# Patient Record
Sex: Female | Born: 1980 | Hispanic: No | Marital: Married | Smoking: Never smoker
Health system: Southern US, Community
[De-identification: ages and names within clinical notes are randomized; demographics above are authoritative.]

## PROBLEM LIST (undated history)

## (undated) ENCOUNTER — Inpatient Hospital Stay: Admission: AD | Payer: Self-pay | Source: Home / Self Care

## (undated) ENCOUNTER — Inpatient Hospital Stay (HOSPITAL_COMMUNITY): Payer: Self-pay

## (undated) DIAGNOSIS — Z789 Other specified health status: Secondary | ICD-10-CM

## (undated) HISTORY — PX: NO PAST SURGERIES: SHX2092

---

## 2016-12-27 NOTE — L&D Delivery Note (Signed)
Patient is 36 y.o. [id number] [redacted]w[redacted]d admitted for SOL. SROM at 1238.  Prenatal course also complicated by unknown prenatal history as patient is from [place].  Delivery Note At 9:33 PM a viable female was delivered via Vaginal, Vacuum (Extractor) (Presentation: ROA).  APGAR: 8, 9; weight pending.   Placenta status: Avulsion of cord with manual extraction of placenta by Dr. Debroah Loop.  Cord: 3V.  Cord pH: N/A  Anesthesia:  Epidural Episiotomy: None Lacerations: 2nd degree deep Suture Repair: 3.0 vicryl Est. Blood Loss (mL): 500  Mom to postpartum.  Baby to Couplet care / Skin to Skin.  Patient pushing, and despite good maternal effort, unable to deliver fetal head, and decision made for vacuum-assisted delivery. R/B/A discussed with mother. She gave verbal consent. The vacuum soft cup was positioned over the sagittal suture 3 cm anterior to posterior fontanelle.  Pressure was then increased to 500 mmHg, and the patient was instructed to push.  Pulling was administered along the pelvic curve. She pushed with good maternal effort to deliver a viable infant. No nuchal cord present. Infant did have body cord and foot cord,easily reduced. Baby delivered without difficulty, was noted to have good tone and place on maternal abdomen for oral suctioning, drying and stimulation. Delayed cord clamping performed. Placenta cord avulsion occured and manual extraction of placenta was performed. Fundus firm with massage and Pitocin. Perineum inspected and found to have deep 2nd degree laceration, which was repaired with good hemostasis achieved. Counts of sharps, instruments, and lap pads were all correct.  Caryl Ada, DO OB Fellow Faculty Practice, [facility] - [facility] 08/22/2017, 9:57 PM

## 2017-02-14 LAB — OB RESULTS CONSOLE PLATELET COUNT: Platelets: 199

## 2017-02-14 LAB — OB RESULTS CONSOLE HEPATITIS B SURFACE ANTIGEN
HEP B S AG: NEGATIVE
Hepatitis B Surface Ag: NEGATIVE

## 2017-02-14 LAB — OB RESULTS CONSOLE HGB/HCT, BLOOD
HCT: 36
Hemoglobin: 11.8

## 2017-02-14 LAB — OB RESULTS CONSOLE RUBELLA ANTIBODY, IGM: Rubella: UNDETERMINED

## 2017-08-03 ENCOUNTER — Inpatient Hospital Stay (HOSPITAL_COMMUNITY)
Admission: AD | Admit: 2017-08-03 | Discharge: 2017-08-03 | Disposition: A | Discharge status: Home / Self Care | Payer: Medicaid Other | Source: Ambulatory Visit | Attending: Obstetrics and Gynecology | Admitting: Obstetrics and Gynecology

## 2017-08-03 ENCOUNTER — Encounter (HOSPITAL_COMMUNITY): Payer: Self-pay | Admitting: Family Medicine

## 2017-08-03 DIAGNOSIS — O479 False labor, unspecified: Secondary | ICD-10-CM

## 2017-08-03 DIAGNOSIS — M7989 Other specified soft tissue disorders: Secondary | ICD-10-CM | POA: Diagnosis present

## 2017-08-03 DIAGNOSIS — R109 Unspecified abdominal pain: Secondary | ICD-10-CM | POA: Diagnosis present

## 2017-08-03 DIAGNOSIS — Z8249 Family history of ischemic heart disease and other diseases of the circulatory system: Secondary | ICD-10-CM | POA: Diagnosis not present

## 2017-08-03 DIAGNOSIS — Z807 Family history of other malignant neoplasms of lymphoid, hematopoietic and related tissues: Secondary | ICD-10-CM | POA: Diagnosis not present

## 2017-08-03 DIAGNOSIS — Z833 Family history of diabetes mellitus: Secondary | ICD-10-CM | POA: Diagnosis not present

## 2017-08-03 HISTORY — DX: Other specified health status: Z78.9

## 2017-08-03 LAB — TYPE AND SCREEN
ABO/RH(D): A POS
ANTIBODY SCREEN: NEGATIVE

## 2017-08-03 LAB — CBC
HEMATOCRIT: 31.7 % — AB (ref 36.0–46.0)
HEMOGLOBIN: 10.7 g/dL — AB (ref 12.0–15.0)
MCH: 28 pg (ref 26.0–34.0)
MCHC: 33.8 g/dL (ref 30.0–36.0)
MCV: 83 fL (ref 78.0–100.0)
Platelets: 186 10*3/uL (ref 150–400)
RBC: 3.82 MIL/uL — ABNORMAL LOW (ref 3.87–5.11)
RDW: 13.9 % (ref 11.5–15.5)
WBC: 7.8 10*3/uL (ref 4.0–10.5)

## 2017-08-03 LAB — DIFFERENTIAL
BASOS ABS: 0 10*3/uL (ref 0.0–0.1)
BASOS PCT: 0 %
Eosinophils Absolute: 0.1 10*3/uL (ref 0.0–0.7)
Eosinophils Relative: 1 %
Lymphocytes Relative: 23 %
Lymphs Abs: 1.8 10*3/uL (ref 0.7–4.0)
MONOS PCT: 7 %
Monocytes Absolute: 0.5 10*3/uL (ref 0.1–1.0)
Neutro Abs: 5.5 10*3/uL (ref 1.7–7.7)
Neutrophils Relative %: 69 %

## 2017-08-03 NOTE — Discharge Instructions (Signed)
Braxton Hicks Contractions °Contractions of the uterus can occur throughout pregnancy, but they are not always a sign that you are in labor. You may have practice contractions called Braxton Hicks contractions. These false labor contractions are sometimes confused with true labor. °What are Braxton Hicks contractions? °Braxton Hicks contractions are tightening movements that occur in the muscles of the uterus before labor. Unlike true labor contractions, these contractions do not result in opening (dilation) and thinning of the cervix. Toward the end of pregnancy (32-34 weeks), Braxton Hicks contractions can happen more often and may become stronger. These contractions are sometimes difficult to tell apart from true labor because they can be very uncomfortable. You should not feel embarrassed if you go to the hospital with false labor. °Sometimes, the only way to tell if you are in true labor is for your health care provider to look for changes in the cervix. The health care provider will do a physical exam and may monitor your contractions. If you are not in true labor, the exam should show that your cervix is not dilating and your water has not broken. °If there are no prenatal problems or other health problems associated with your pregnancy, it is completely safe for you to be sent home with false labor. You may continue to have Braxton Hicks contractions until you go into true labor. °How can I tell the difference between true labor and false labor? °· Differences °? False labor °? Contractions last 30-70 seconds.: Contractions are usually shorter and not as strong as true labor contractions. °? Contractions become very regular.: Contractions are usually irregular. °? Discomfort is usually felt in the top of the uterus, and it spreads to the lower abdomen and low back.: Contractions are often felt in the front of the lower abdomen and in the groin. °? Contractions do not go away with walking.: Contractions may  go away when you walk around or change positions while lying down. °? Contractions usually become more intense and increase in frequency.: Contractions get weaker and are shorter-lasting as time goes on. °? The cervix dilates and gets thinner.: The cervix usually does not dilate or become thin. °Follow these instructions at home: °· Take over-the-counter and prescription medicines only as told by your health care provider. °· Keep up with your usual exercises and follow other instructions from your health care provider. °· Eat and drink lightly if you think you are going into labor. °· If Braxton Hicks contractions are making you uncomfortable: °? Change your position from lying down or resting to walking, or change from walking to resting. °? Sit and rest in a tub of warm water. °? Drink enough fluid to keep your urine clear or pale yellow. Dehydration may cause these contractions. °? Do slow and deep breathing several times an hour. °· Keep all follow-up prenatal visits as told by your health care provider. This is important. °Contact a health care provider if: °· You have a fever. °· You have continuous pain in your abdomen. °Get help right away if: °· Your contractions become stronger, more regular, and closer together. °· You have fluid leaking or gushing from your vagina. °· You pass blood-tinged mucus (bloody show). °· You have bleeding from your vagina. °· You have low back pain that you never had before. °· You feel your baby’s head pushing down and causing pelvic pressure. °· Your baby is not moving inside you as much as it used to. °Summary °· Contractions that occur before labor are   called Braxton Hicks contractions, false labor, or practice contractions.  Braxton Hicks contractions are usually shorter, weaker, farther apart, and less regular than true labor contractions. True labor contractions usually become progressively stronger and regular and they become more frequent.  Manage discomfort from  [facility] Center[facility] contractions by changing position, resting in a warm bath, drinking plenty of water, or practicing deep breathing. This information is not intended to replace advice given to you by your health care provider. Make sure you discuss any questions you have with your health care provider. Document Released: 12/13/2005 Document Revised: 11/01/2016 Document Reviewed: 11/01/2016 Elsevier Interactive Patient Education  2017 Elsevier Inc.   Edema Edema is when you have too much fluid in your body or under your skin. Edema may make your legs, feet, and ankles swell up. Swelling is also common in looser tissues, like around your eyes. This is a common condition. It gets more common as you get older. There are many possible causes of edema. Eating too much salt (sodium) and being on your feet or sitting for a long time can cause edema in your legs, feet, and ankles. Hot weather may make edema worse. Edema is usually painless. Your skin may look swollen or shiny. Follow these instructions at home:  Keep the swollen body part raised (elevated) above the level of your heart when you are sitting or lying down.  Do not sit still or stand for a long time.  Do not wear tight clothes. Do not wear garters on your upper legs.  Exercise your legs. This can help the swelling go down.  Wear elastic bandages or support stockings as told by your doctor.  Eat a low-salt (low-sodium) diet to reduce fluid as told by your doctor.  Depending on the cause of your swelling, you may need to limit how much fluid you drink (fluid restriction).  Take over-the-counter and prescription medicines only as told by your doctor. Contact a doctor if:  Treatment is not working.  You have heart, liver, or kidney disease and have symptoms of edema.  You have sudden and unexplained weight gain. Get help right away if:  You have shortness of breath or chest pain.  You cannot breathe when you lie down.  You have  pain, redness, or warmth in the swollen areas.  You have heart, liver, or kidney disease and get edema all of a sudden.  You have a fever and your symptoms get worse all of a sudden. Summary  Edema is when you have too much fluid in your body or under your skin.  Edema may make your legs, feet, and ankles swell up. Swelling is also common in looser tissues, like around your eyes.  Raise (elevate) the swollen body part above the level of your heart when you are sitting or lying down.  Follow your doctor's instructions about diet and how much fluid you can drink (fluid restriction). This information is not intended to replace advice given to you by your health care provider. Make sure you discuss any questions you have with your health care provider. Document Released: 05/31/2008 Document Revised: 12/31/2016 Document Reviewed: 12/31/2016 Elsevier Interactive Patient Education  2017 [employer]

## 2017-08-03 NOTE — MAU Note (Signed)
Pt presents with complaint of abd pain and reports increased swelling in her legs, states R>L. Pt states she had care in [place] until about 1.5 months ago , plans to return in one week

## 2017-08-03 NOTE — MAU Provider Note (Signed)
  History     CSN: [id number]  Arrival date and time: 08/03/17 1756   None    Chief Complaint  Patient presents with  . Abdominal Pain  . Leg Swelling   HPI  Patient is complaining of cramping and pain in her lower abdomen. The pain is not constant but comes and goes. It started this morning. She is also complaining of increased edema in her legs, and stating her right leg is more swollen than her left. No pain in her legs. No associated erythema. She has recently been walking more and spent a lot of time walking in [place]. She has not had care for 1-1.5 months since she has arrived here to visit her sister. All her prenatal care is in [place]. The patient plans to fly back in one week to [place].   +FM, No LOF, No vaginal bleeding or discharge  OB History    Gravida Para Term Preterm AB Living   3 1 1  0 1 1   SAB TAB Ectopic Multiple Live Births   1              History reviewed. No pertinent past medical history.  History reviewed. No pertinent surgical history.  Family History  Problem Relation Age of Onset  . Diabetes Mother   . Hypertension Mother   . Hypertension Father   . Lymphoma Sister     Social History  Substance Use Topics  . Smoking status: Never Smoker  . Smokeless tobacco: Never Used  . Alcohol use No    Allergies: No Known Allergies  No prescriptions prior to admission.    Review of Systems  Cardiovascular: Positive for leg swelling.  Genitourinary: Positive for pelvic pain. Negative for vaginal bleeding and vaginal discharge.   Physical Exam   Temp: 98.5; Pulse: 103; RR 19; BP: 124/75; Pox: 100%  Physical Exam  Constitutional: She is oriented to person, place, and time. She appears well-developed and well-nourished.  HENT:  Head: Normocephalic.  Eyes: Conjunctivae are normal.  Neck: Normal range of motion. Neck supple.  Respiratory: Effort normal.  GI: Soft.  Genitourinary: No bleeding in the vagina. No vaginal discharge found.   Neurological: She is alert and oriented to person, place, and time.  Skin: Skin is warm and dry.  Extremities: no edema noted on BLLE Dilation: Closed Effacement (%): Thick Cervical Position: Posterior Station: -3 Presentation: Vertex Exam by:: weston,RN   MAU Course  Procedures  MDM: Patient examined by nurse with speculum and found to be closed.  Patient to have all prenatal labs drawn per orders FHT reactive; Toco with contractions every 5-10 minutes  Assessment and Plan  #BLLE Edema, Benign: No signs of DVT, educated Most likely psychologic leg swelling due to pregnancy  #Braxton Hick's Contractions, False Labor: Educated on her contractions Discussed return precautions for true labor. She was educated on the risks of going back to [place] on an 18 hour flight in one week. Risks of DVT explained. She was educated that she is not in active labor, but it could happen before her flight.  Prenatal labs ordered for patient along with cultures  Discharge home in stable condition  Handout given with information; see AVS  [place] Shirley 08/03/2017, 6:48 PM   OB FELLOW MAU DISCHARGE ATTESTATION  I have seen and examined this patient. I agree with above documentation in resident's note and have made edits as needed.   Caryl AdaJazma Taiz Bickle, DO OB Fellow 7:39 PM

## 2017-08-04 LAB — HIV ANTIBODY (ROUTINE TESTING W REFLEX): HIV SCREEN 4TH GENERATION: NONREACTIVE

## 2017-08-04 LAB — HEPATITIS B SURFACE ANTIGEN: HEP B S AG: NEGATIVE

## 2017-08-04 LAB — GC/CHLAMYDIA PROBE AMP (~~LOC~~) NOT AT ARMC
CHLAMYDIA, DNA PROBE: NEGATIVE
Neisseria Gonorrhea: NEGATIVE

## 2017-08-04 LAB — RUBELLA SCREEN: Rubella: 0.97 index — ABNORMAL LOW (ref >0.99)

## 2017-08-04 LAB — RPR: RPR Ser Ql: NONREACTIVE

## 2017-08-04 LAB — ABO/RH: ABO/RH(D): A POS

## 2017-08-06 LAB — CULTURE, BETA STREP (GROUP B ONLY)

## 2017-08-06 LAB — OB RESULTS CONSOLE GBS: STREP GROUP B AG: POSITIVE

## 2017-08-17 ENCOUNTER — Encounter: Payer: Self-pay | Admitting: *Deleted

## 2017-08-18 ENCOUNTER — Telehealth (HOSPITAL_COMMUNITY): Payer: Self-pay | Admitting: *Deleted

## 2017-08-18 ENCOUNTER — Encounter: Payer: Self-pay | Admitting: Medical

## 2017-08-18 ENCOUNTER — Ambulatory Visit (INDEPENDENT_AMBULATORY_CARE_PROVIDER_SITE_OTHER): Payer: Self-pay | Admitting: Medical

## 2017-08-18 DIAGNOSIS — Z3483 Encounter for supervision of other normal pregnancy, third trimester: Secondary | ICD-10-CM

## 2017-08-18 DIAGNOSIS — Z3480 Encounter for supervision of other normal pregnancy, unspecified trimester: Secondary | ICD-10-CM | POA: Insufficient documentation

## 2017-08-18 LAB — POCT URINALYSIS DIP (DEVICE)
BILIRUBIN URINE: NEGATIVE
Glucose, UA: NEGATIVE mg/dL
Ketones, ur: NEGATIVE mg/dL
Leukocytes, UA: NEGATIVE
NITRITE: NEGATIVE
PH: 5 (ref 5.0–8.0)
Protein, ur: NEGATIVE mg/dL
Specific Gravity, Urine: 1.01 (ref 1.005–1.030)
UROBILINOGEN UA: 0.2 mg/dL (ref 0.0–1.0)

## 2017-08-18 NOTE — Patient Instructions (Signed)
Fetal Movement Counts °Patient Name: ________________________________________________ Patient Due Date: ____________________ °What is a fetal movement count? °A fetal movement count is the number of times that you feel your baby move during a certain amount of time. This may also be called a fetal kick count. A fetal movement count is recommended for every pregnant woman. You may be asked to start counting fetal movements as early as week 28 of your pregnancy. °Pay attention to when your baby is most active. You may notice your baby's sleep and wake cycles. You may also notice things that make your baby move more. You should do a fetal movement count: °· When your baby is normally most active. °· At the same time each day. ° °A good time to count movements is while you are resting, after having something to eat and drink. °How do I count fetal movements? °1. Find a quiet, comfortable area. Sit, or lie down on your side. °2. Write down the date, the start time and stop time, and the number of movements that you felt between those two times. Take this information with you to your health care visits. °3. For 2 hours, count kicks, flutters, swishes, rolls, and jabs. You should feel at least 10 movements during 2 hours. °4. You may stop counting after you have felt 10 movements. °5. If you do not feel 10 movements in 2 hours, have something to eat and drink. Then, keep resting and counting for 1 hour. If you feel at least 4 movements during that hour, you may stop counting. °Contact a health care provider if: °· You feel fewer than 4 movements in 2 hours. °· Your baby is not moving like he or she usually does. °Date: ____________ Start time: ____________ Stop time: ____________ Movements: ____________ °Date: ____________ Start time: ____________ Stop time: ____________ Movements: ____________ °Date: ____________ Start time: ____________ Stop time: ____________ Movements: ____________ °Date: ____________ Start time:  ____________ Stop time: ____________ Movements: ____________ °Date: ____________ Start time: ____________ Stop time: ____________ Movements: ____________ °Date: ____________ Start time: ____________ Stop time: ____________ Movements: ____________ °Date: ____________ Start time: ____________ Stop time: ____________ Movements: ____________ °Date: ____________ Start time: ____________ Stop time: ____________ Movements: ____________ °Date: ____________ Start time: ____________ Stop time: ____________ Movements: ____________ °This information is not intended to replace advice given to you by your health care provider. Make sure you discuss any questions you have with your health care provider. °Document Released: 01/12/2007 Document Revised: 08/11/2016 Document Reviewed: 01/22/2016 °Elsevier Interactive Patient Education © 2018 Elsevier Inc. °Braxton Hicks Contractions °Contractions of the uterus can occur throughout pregnancy, but they are not always a sign that you are in labor. You may have practice contractions called Braxton Hicks contractions. These false labor contractions are sometimes confused with true labor. °What are Braxton Hicks contractions? °Braxton Hicks contractions are tightening movements that occur in the muscles of the uterus before labor. Unlike true labor contractions, these contractions do not result in opening (dilation) and thinning of the cervix. Toward the end of pregnancy (32-34 weeks), Braxton Hicks contractions can happen more often and may become stronger. These contractions are sometimes difficult to tell apart from true labor because they can be very uncomfortable. You should not feel embarrassed if you go to the hospital with false labor. °Sometimes, the only way to tell if you are in true labor is for your health care provider to look for changes in the cervix. The health care provider will do a physical exam and may monitor your contractions. If   you are not in true labor, the exam  should show that your cervix is not dilating and your water has not broken. °If there are no prenatal problems or other health problems associated with your pregnancy, it is completely safe for you to be sent home with false labor. You may continue to have Braxton Hicks contractions until you go into true labor. °How can I tell the difference between true labor and false labor? °· Differences °? False labor °? Contractions last 30-70 seconds.: Contractions are usually shorter and not as strong as true labor contractions. °? Contractions become very regular.: Contractions are usually irregular. °? Discomfort is usually felt in the top of the uterus, and it spreads to the lower abdomen and low back.: Contractions are often felt in the front of the lower abdomen and in the groin. °? Contractions do not go away with walking.: Contractions may go away when you walk around or change positions while lying down. °? Contractions usually become more intense and increase in frequency.: Contractions get weaker and are shorter-lasting as time goes on. °? The cervix dilates and gets thinner.: The cervix usually does not dilate or become thin. °Follow these instructions at home: °· Take over-the-counter and prescription medicines only as told by your health care provider. °· Keep up with your usual exercises and follow other instructions from your health care provider. °· Eat and drink lightly if you think you are going into labor. °· If Braxton Hicks contractions are making you uncomfortable: °? Change your position from lying down or resting to walking, or change from walking to resting. °? Sit and rest in a tub of warm water. °? Drink enough fluid to keep your urine clear or pale yellow. Dehydration may cause these contractions. °? Do slow and deep breathing several times an hour. °· Keep all follow-up prenatal visits as told by your health care provider. This is important. °Contact a health care provider if: °· You have a  fever. °· You have continuous pain in your abdomen. °Get help right away if: °· Your contractions become stronger, more regular, and closer together. °· You have fluid leaking or gushing from your vagina. °· You pass blood-tinged mucus (bloody show). °· You have bleeding from your vagina. °· You have low back pain that you never had before. °· You feel your baby’s head pushing down and causing pelvic pressure. °· Your baby is not moving inside you as much as it used to. °Summary °· Contractions that occur before labor are called Braxton Hicks contractions, false labor, or practice contractions. °· Braxton Hicks contractions are usually shorter, weaker, farther apart, and less regular than true labor contractions. True labor contractions usually become progressively stronger and regular and they become more frequent. °· Manage discomfort from Braxton Hicks contractions by changing position, resting in a warm bath, drinking plenty of water, or practicing deep breathing. °This information is not intended to replace advice given to you by your health care provider. Make sure you discuss any questions you have with your health care provider. °Document Released: 12/13/2005 Document Revised: 11/01/2016 Document Reviewed: 11/01/2016 °Elsevier Interactive Patient Education © 2017 Elsevier Inc. ° °

## 2017-08-18 NOTE — Progress Notes (Signed)
   PRENATAL VISIT NOTE  Subjective:  Brenda Frazier is a 36 y.o. G3P1011 at [redacted]w[redacted]d being seen today for ongoing prenatal care.  She is currently monitored for the following issues for this low-risk pregnancy and has Supervision of other normal pregnancy, antepartum on her problem list.  Patient reports swelling in feet, SOB at times.  Contractions: Irregular. Vag. Bleeding: None.  Movement: Present. Denies leaking of fluid.   The following portions of the patient's history were reviewed and updated as appropriate: allergies, current medications, past family history, past medical history, past social history, past surgical history and problem list. Problem list updated.  Objective:   Vitals:   08/18/17 0842 08/18/17 0852  BP: 122/74   Pulse: 88   Weight: 206 lb 12.8 oz (93.8 kg)   Height:  5' 3.78" (1.62 m)    Fetal Status: Fetal Heart Rate (bpm): 165 Fundal Height: 39 cm Movement: Present     General:  Alert, oriented and cooperative. Patient is in no acute distress.  Skin: Skin is warm and dry. No rash noted.   Cardiovascular: Normal heart rate noted  Respiratory: Normal respiratory effort, no problems with respiration noted  Abdomen: Soft, gravid, appropriate for gestational age.  Pain/Pressure: Present     Pelvic: Cervical exam performed Dilation: Closed Effacement (%): 50 Station: Ballotable  Extremities: Normal range of motion.  Edema: Trace  Mental Status:  Normal mood and affect. Normal behavior. Normal judgment and thought content.   Assessment and Plan:  Pregnancy: G3P1011 at [redacted]w[redacted]d  1. Supervision of other normal pregnancy, antepartum - Wasn't planning to be here for delivery, but told not to travel after seen in MAU - IOL scheduled for 41 weeks - Plans to return to [place] after delivery   Term labor symptoms and general obstetric precautions including but not limited to vaginal bleeding, contractions, leaking of fluid and fetal movement were reviewed in detail with the  patient. Please refer to After Visit Summary for other counseling recommendations.  Return if symptoms worsen or fail to improve.   Vonzella Nipple, PA-C

## 2017-08-19 ENCOUNTER — Other Ambulatory Visit: Payer: Self-pay | Admitting: Medical

## 2017-08-19 NOTE — Telephone Encounter (Signed)
Preadmission screen  

## 2017-08-21 ENCOUNTER — Encounter (HOSPITAL_COMMUNITY): Payer: Self-pay

## 2017-08-21 ENCOUNTER — Inpatient Hospital Stay (HOSPITAL_COMMUNITY)
Admission: AD | Admit: 2017-08-21 | Discharge: 2017-08-24 | DRG: 767 | Disposition: A | Discharge status: Home / Self Care | Payer: Medicaid Other | Source: Ambulatory Visit | Attending: Obstetrics & Gynecology | Admitting: Obstetrics & Gynecology

## 2017-08-21 DIAGNOSIS — O4693 Antepartum hemorrhage, unspecified, third trimester: Secondary | ICD-10-CM

## 2017-08-21 DIAGNOSIS — E669 Obesity, unspecified: Secondary | ICD-10-CM | POA: Diagnosis present

## 2017-08-21 DIAGNOSIS — O9902 Anemia complicating childbirth: Secondary | ICD-10-CM | POA: Diagnosis present

## 2017-08-21 DIAGNOSIS — O480 Post-term pregnancy: Secondary | ICD-10-CM | POA: Diagnosis present

## 2017-08-21 DIAGNOSIS — D649 Anemia, unspecified: Secondary | ICD-10-CM | POA: Diagnosis present

## 2017-08-21 DIAGNOSIS — Z3A40 40 weeks gestation of pregnancy: Secondary | ICD-10-CM

## 2017-08-21 DIAGNOSIS — Z6835 Body mass index (BMI) 35.0-35.9, adult: Secondary | ICD-10-CM

## 2017-08-21 DIAGNOSIS — Z8759 Personal history of other complications of pregnancy, childbirth and the puerperium: Secondary | ICD-10-CM

## 2017-08-21 DIAGNOSIS — O99824 Streptococcus B carrier state complicating childbirth: Secondary | ICD-10-CM | POA: Diagnosis present

## 2017-08-21 DIAGNOSIS — O479 False labor, unspecified: Secondary | ICD-10-CM

## 2017-08-21 DIAGNOSIS — O99214 Obesity complicating childbirth: Secondary | ICD-10-CM | POA: Diagnosis present

## 2017-08-21 NOTE — MAU Note (Signed)
CTX since 1900 every 3-4 mins lasting 60 secs.  No VB or LOF.  Reports good FM.

## 2017-08-22 ENCOUNTER — Inpatient Hospital Stay (HOSPITAL_COMMUNITY): Payer: Medicaid Other | Admitting: Anesthesiology

## 2017-08-22 ENCOUNTER — Encounter (HOSPITAL_COMMUNITY): Payer: Self-pay

## 2017-08-22 ENCOUNTER — Ambulatory Visit (HOSPITAL_COMMUNITY): Payer: Medicaid Other

## 2017-08-22 DIAGNOSIS — O99824 Streptococcus B carrier state complicating childbirth: Secondary | ICD-10-CM | POA: Diagnosis not present

## 2017-08-22 DIAGNOSIS — Z6835 Body mass index (BMI) 35.0-35.9, adult: Secondary | ICD-10-CM | POA: Diagnosis not present

## 2017-08-22 DIAGNOSIS — O99214 Obesity complicating childbirth: Secondary | ICD-10-CM | POA: Diagnosis present

## 2017-08-22 DIAGNOSIS — E669 Obesity, unspecified: Secondary | ICD-10-CM | POA: Diagnosis present

## 2017-08-22 DIAGNOSIS — O480 Post-term pregnancy: Secondary | ICD-10-CM | POA: Diagnosis present

## 2017-08-22 DIAGNOSIS — O9902 Anemia complicating childbirth: Secondary | ICD-10-CM | POA: Diagnosis present

## 2017-08-22 DIAGNOSIS — Z3A40 40 weeks gestation of pregnancy: Secondary | ICD-10-CM | POA: Diagnosis not present

## 2017-08-22 DIAGNOSIS — Z8759 Personal history of other complications of pregnancy, childbirth and the puerperium: Secondary | ICD-10-CM

## 2017-08-22 DIAGNOSIS — Z3493 Encounter for supervision of normal pregnancy, unspecified, third trimester: Secondary | ICD-10-CM | POA: Diagnosis present

## 2017-08-22 DIAGNOSIS — D649 Anemia, unspecified: Secondary | ICD-10-CM | POA: Diagnosis present

## 2017-08-22 LAB — TYPE AND SCREEN
ABO/RH(D): A POS
Antibody Screen: NEGATIVE

## 2017-08-22 LAB — CBC
HEMATOCRIT: 33.8 % — AB (ref 36.0–46.0)
HEMOGLOBIN: 11.1 g/dL — AB (ref 12.0–15.0)
MCH: 27 pg (ref 26.0–34.0)
MCHC: 32.8 g/dL (ref 30.0–36.0)
MCV: 82.2 fL (ref 78.0–100.0)
Platelets: 176 10*3/uL (ref 150–400)
RBC: 4.11 MIL/uL (ref 3.87–5.11)
RDW: 14.3 % (ref 11.5–15.5)
WBC: 9.9 10*3/uL (ref 4.0–10.5)

## 2017-08-22 LAB — RPR: RPR Ser Ql: NONREACTIVE

## 2017-08-22 IMAGING — US US MFM OB LIMITED
1 series · 11 of 11 positions shown · non-contrast
Comparison: none

[Series 1: us mfm ob limited · 11 acquisitions, 11 frames shown]
[im 1/11]
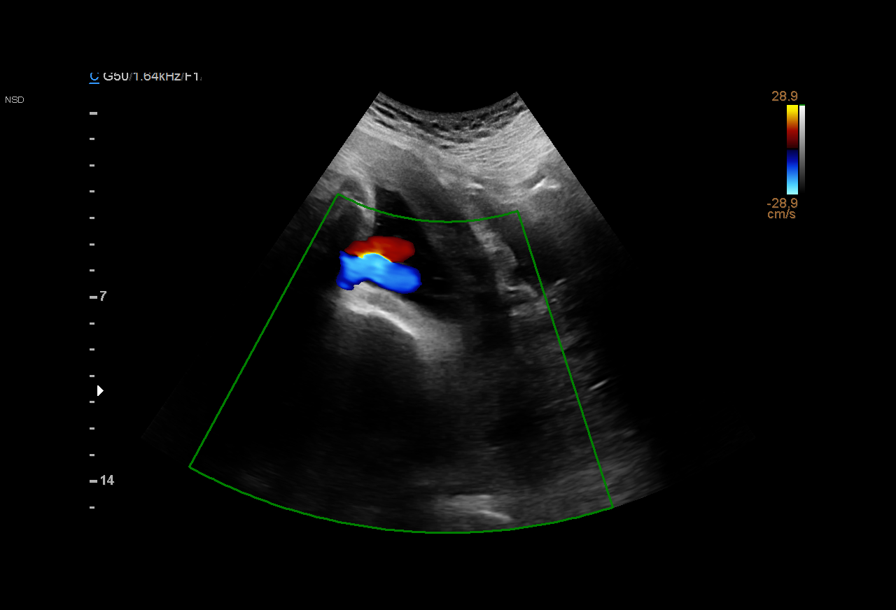
[im 2/11]
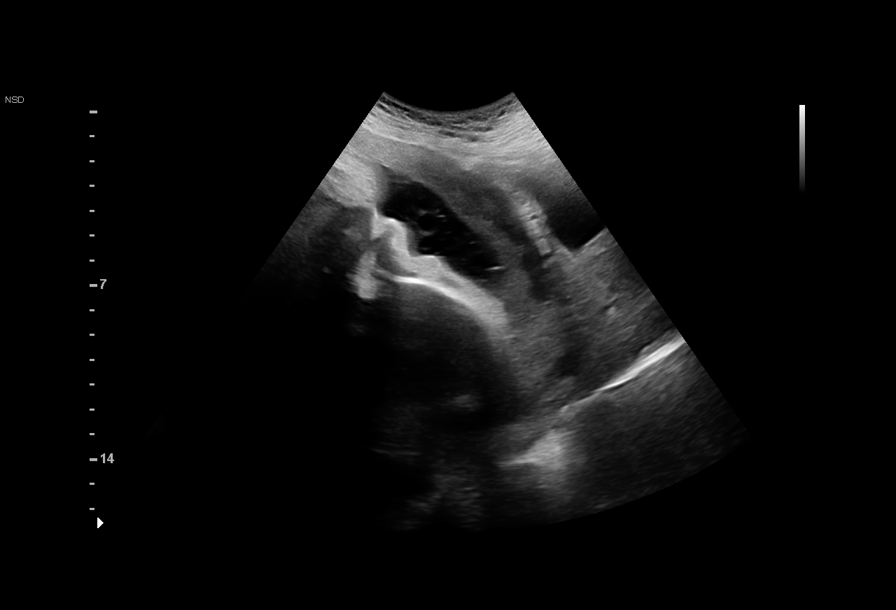
[im 3/11]
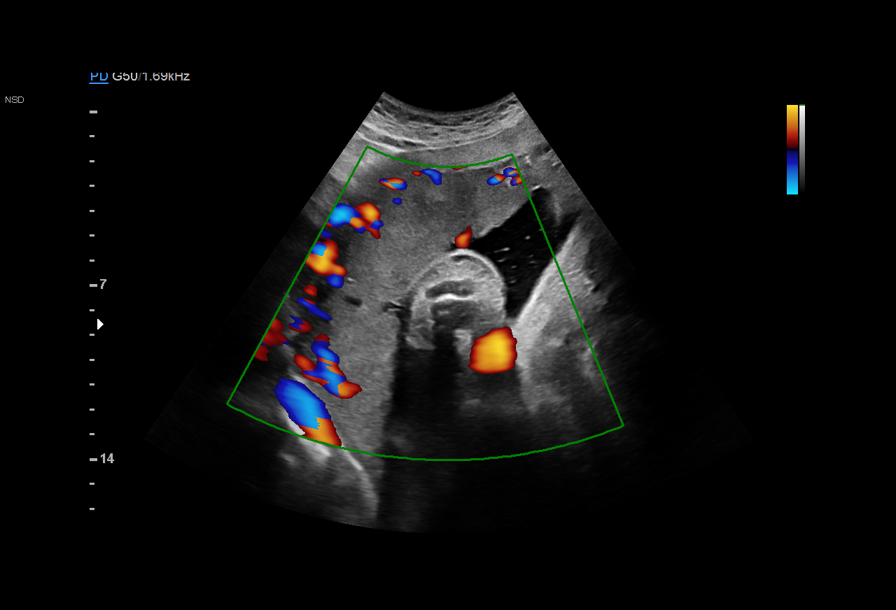
[im 4/11]
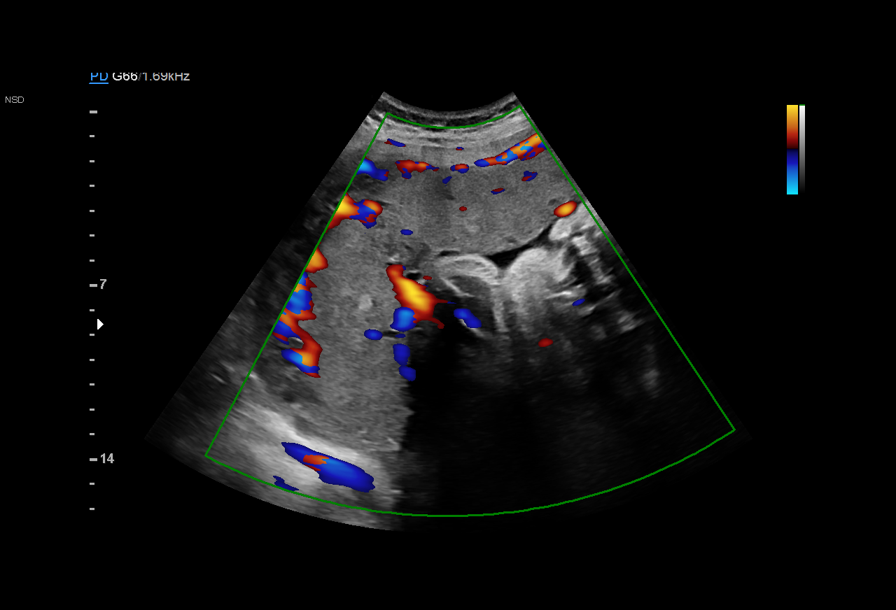
[im 5/11]
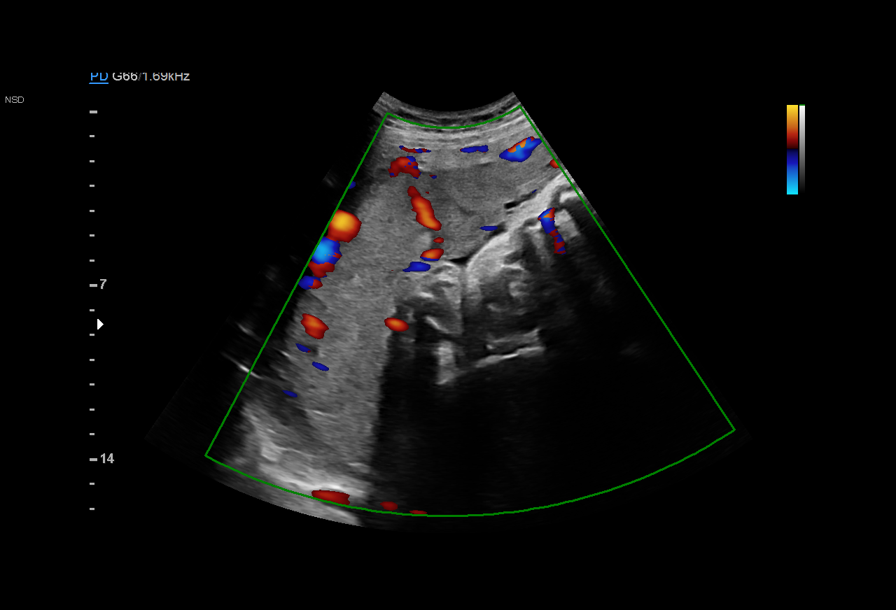
[im 6/11]
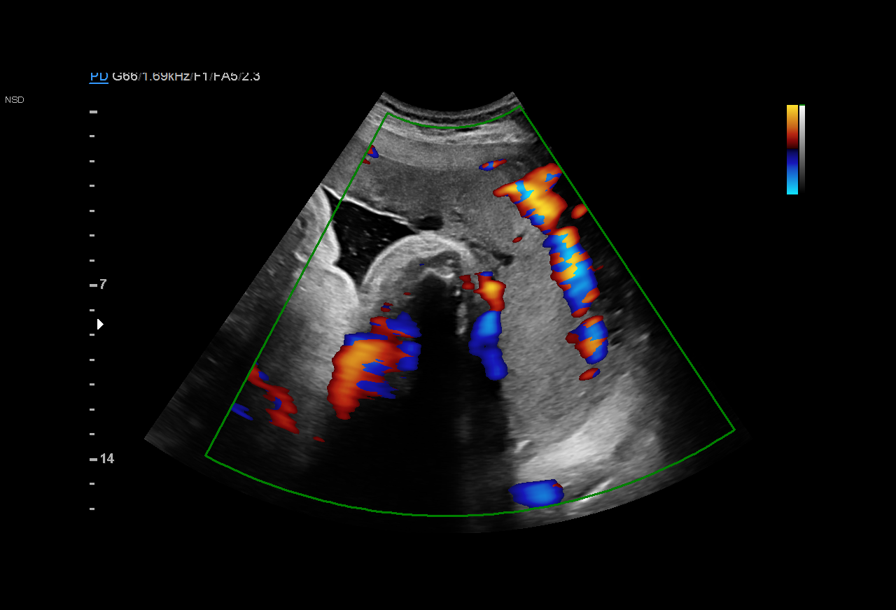
[im 7/11]
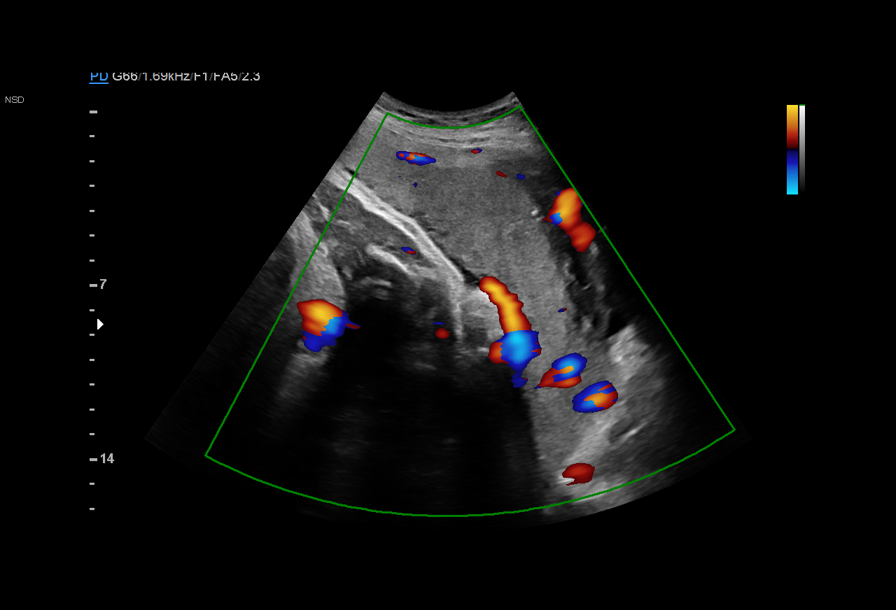
[im 8/11]
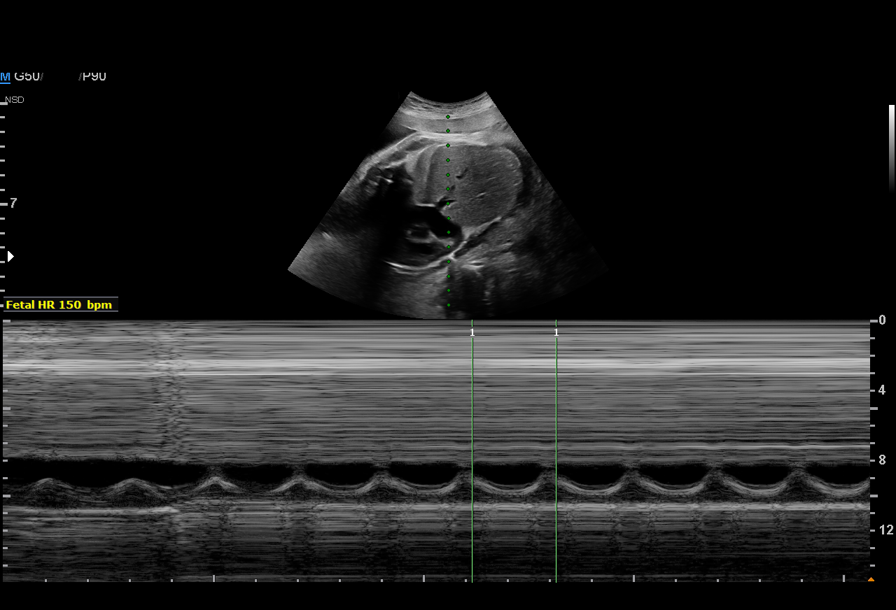
[im 9/11]
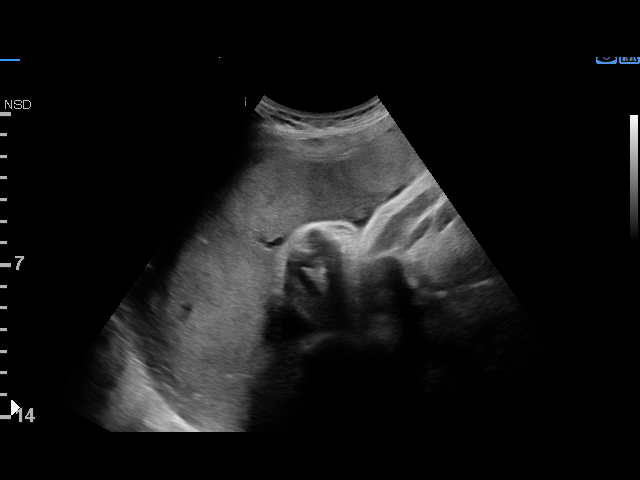
[im 10/11]
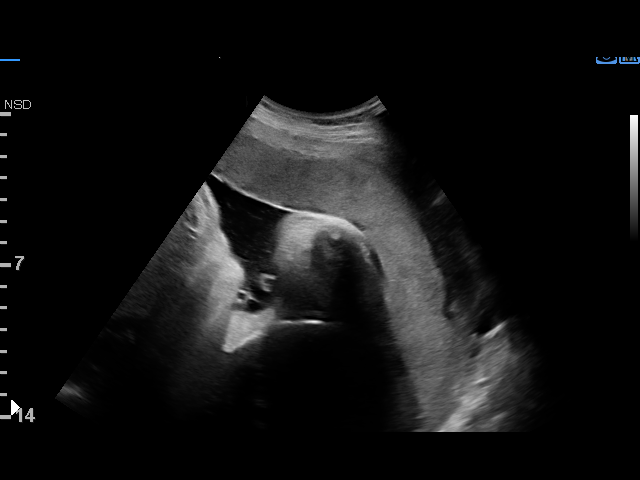
[im 11/11]
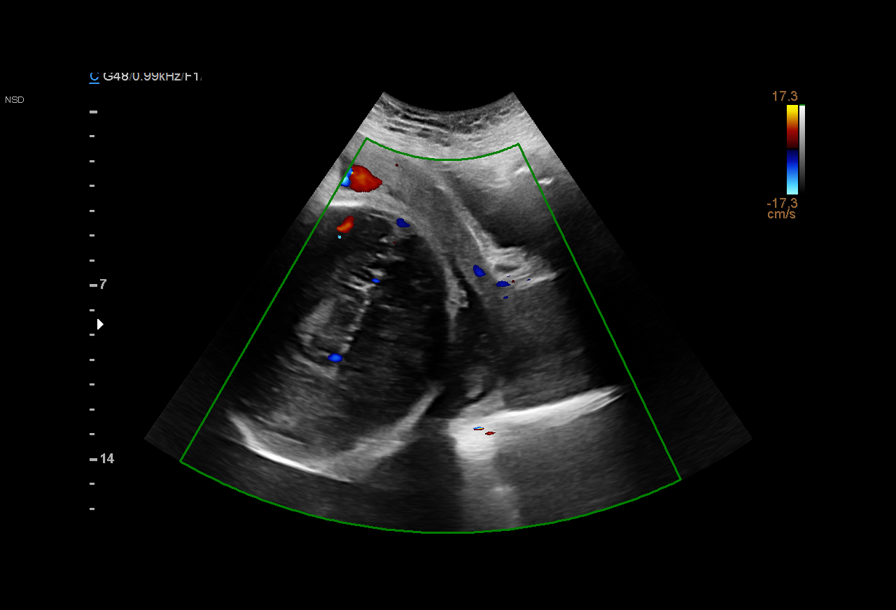

[11 of 11 positions shown; findings below may reference images not displayed]

Attending:        WATANABE       Secondary Phy.:    WATANABE Nursing-
MAU/Triage

1  WATANABE             [PHONE_NUMBER]      [PHONE_NUMBER]     [PHONE_NUMBER]
Indications

40 weeks gestation of pregnancy
Postdate pregnancy (40-42 weeks)               [GS]
Vaginal bleeding in pregnancy, third trimester [GS]
OB History

Gravidity:    3         Term:   1        Prem:   0        SAB:   1
TOP:          0       Ectopic:  0        Living: 1
Fetal Evaluation

Num Of Fetuses:     1
Fetal Heart         150
Rate(bpm):
Cardiac Activity:   Observed
Presentation:       Cephalic
Placenta:           Fundal, above cervical os
P. Cord Insertion:  Visualized

Amniotic Fluid
AFI FV:      Subjectively within normal limits

AFI Sum(cm)     %Tile       Largest Pocket(cm)
16.79           74

RUQ(cm)       RLQ(cm)       LUQ(cm)        LLQ(cm)
8.48
Gestational Age

Clinical EDD:  40w 2d                                        EDD:   [DATE]
Best:          40w 2d     Det. By:  Clinical EDD             EDD:   [DATE]
Cervix Uterus Adnexa

Cervix
Not visualized (advanced GA >[GS])
Impression

SIUP at [GS] (remote read only)
active fetus
cephalic presentation
AFI is normal
no previa
Recommendations

Suggest correlation with clinical exam and fetal heart tracing.

## 2017-08-22 MED ORDER — LACTATED RINGERS IV SOLN
INTRAVENOUS | Status: DC
  Administered 2017-08-22 (×4): via INTRAVENOUS

## 2017-08-22 MED ORDER — OXYCODONE-ACETAMINOPHEN 5-325 MG PO TABS
2.0000 | ORAL_TABLET | ORAL | Status: DC | PRN
  Administered 2017-08-23: 2 via ORAL
  Filled 2017-08-22: qty 2

## 2017-08-22 MED ORDER — LIDOCAINE HCL (PF) 1 % IJ SOLN
30.0000 mL | INTRAMUSCULAR | Status: DC | PRN
  Filled 2017-08-22: qty 30

## 2017-08-22 MED ORDER — PENICILLIN G POT IN DEXTROSE 60000 UNIT/ML IV SOLN
3.0000 10*6.[IU] | INTRAVENOUS | Status: DC
  Administered 2017-08-22 (×3): 3 10*6.[IU] via INTRAVENOUS
  Filled 2017-08-22 (×6): qty 50

## 2017-08-22 MED ORDER — EPHEDRINE 5 MG/ML INJ
10.0000 mg | INTRAVENOUS | Status: DC | PRN
  Filled 2017-08-22: qty 2

## 2017-08-22 MED ORDER — ONDANSETRON HCL 4 MG/2ML IJ SOLN
4.0000 mg | Freq: Four times a day (QID) | INTRAMUSCULAR | Status: DC | PRN
  Administered 2017-08-22: 4 mg via INTRAVENOUS
  Filled 2017-08-22: qty 2

## 2017-08-22 MED ORDER — LIDOCAINE HCL (PF) 1 % IJ SOLN
INTRAMUSCULAR | Status: DC | PRN
  Administered 2017-08-22: 5 mL via EPIDURAL
  Administered 2017-08-22: 2 mL via EPIDURAL
  Administered 2017-08-22: 3 mL via EPIDURAL

## 2017-08-22 MED ORDER — DIPHENHYDRAMINE HCL 50 MG/ML IJ SOLN: 12.5000 mg | INTRAMUSCULAR | Status: DC | PRN

## 2017-08-22 MED ORDER — FLEET ENEMA 7-19 GM/118ML RE ENEM: 1.0000 | ENEMA | RECTAL | Status: DC | PRN

## 2017-08-22 MED ORDER — LACTATED RINGERS IV SOLN
500.0000 mL | Freq: Once | INTRAVENOUS | Status: AC
  Administered 2017-08-22: 500 mL via INTRAVENOUS

## 2017-08-22 MED ORDER — LACTATED RINGERS IV SOLN
500.0000 mL | INTRAVENOUS | Status: DC | PRN
  Administered 2017-08-22: 500 mL via INTRAVENOUS
  Administered 2017-08-22 (×2): 1000 mL via INTRAVENOUS
  Administered 2017-08-22: 500 mL via INTRAVENOUS
  Administered 2017-08-22 (×2): 1000 mL via INTRAVENOUS

## 2017-08-22 MED ORDER — SOD CITRATE-CITRIC ACID 500-334 MG/5ML PO SOLN
30.0000 mL | ORAL | Status: DC | PRN
  Filled 2017-08-22: qty 15

## 2017-08-22 MED ORDER — FENTANYL CITRATE (PF) 100 MCG/2ML IJ SOLN: 50.0000 ug | INTRAMUSCULAR | Status: DC | PRN

## 2017-08-22 MED ORDER — TERBUTALINE SULFATE 1 MG/ML IJ SOLN
INTRAMUSCULAR | Status: AC
  Filled 2017-08-22: qty 1

## 2017-08-22 MED ORDER — PHENYLEPHRINE 40 MCG/ML (10ML) SYRINGE FOR IV PUSH (FOR BLOOD PRESSURE SUPPORT)
80.0000 ug | PREFILLED_SYRINGE | INTRAVENOUS | Status: DC | PRN
  Administered 2017-08-22: 80 ug via INTRAVENOUS
  Filled 2017-08-22: qty 5

## 2017-08-22 MED ORDER — TERBUTALINE SULFATE 1 MG/ML IJ SOLN
INTRAMUSCULAR | Status: AC
  Administered 2017-08-22: 0.25 mg via SUBCUTANEOUS
  Filled 2017-08-22: qty 1

## 2017-08-22 MED ORDER — OXYTOCIN 40 UNITS IN LACTATED RINGERS INFUSION - SIMPLE MED
2.5000 [IU]/h | INTRAVENOUS | Status: DC
  Filled 2017-08-22: qty 1000

## 2017-08-22 MED ORDER — PENICILLIN G POTASSIUM 5000000 UNITS IJ SOLR
5.0000 10*6.[IU] | Freq: Once | INTRAVENOUS | Status: AC
  Administered 2017-08-22: 5 10*6.[IU] via INTRAVENOUS
  Filled 2017-08-22: qty 5

## 2017-08-22 MED ORDER — OXYCODONE-ACETAMINOPHEN 5-325 MG PO TABS: 1.0000 | ORAL_TABLET | ORAL | Status: DC | PRN

## 2017-08-22 MED ORDER — FENTANYL 2.5 MCG/ML BUPIVACAINE 1/10 % EPIDURAL INFUSION (WH - ANES)
14.0000 mL/h | INTRAMUSCULAR | Status: DC | PRN
  Administered 2017-08-22 (×3): 14 mL/h via EPIDURAL
  Filled 2017-08-22 (×3): qty 100

## 2017-08-22 MED ORDER — ACETAMINOPHEN 325 MG PO TABS
650.0000 mg | ORAL_TABLET | ORAL | Status: DC | PRN
  Administered 2017-08-22 (×2): 650 mg via ORAL
  Filled 2017-08-22 (×2): qty 2

## 2017-08-22 MED ORDER — PHENYLEPHRINE 40 MCG/ML (10ML) SYRINGE FOR IV PUSH (FOR BLOOD PRESSURE SUPPORT)
80.0000 ug | PREFILLED_SYRINGE | INTRAVENOUS | Status: DC | PRN
  Administered 2017-08-22 (×2): 80 ug via INTRAVENOUS
  Filled 2017-08-22: qty 10
  Filled 2017-08-22: qty 5
  Filled 2017-08-22 (×2): qty 10

## 2017-08-22 MED ORDER — OXYTOCIN BOLUS FROM INFUSION
500.0000 mL | Freq: Once | INTRAVENOUS | Status: AC
  Administered 2017-08-22: 500 mL via INTRAVENOUS

## 2017-08-22 NOTE — Progress Notes (Signed)
Brenda Frazier is a 36 y.o. G3P1011 at [redacted]w[redacted]d admitted for active labor, vaginal bleeding at term  Subjective: Pt comfortable with epidural. Sister in room for support.   Objective: BP (!) 86/60   Pulse 82   Temp 98.7 F (37.1 C) (Oral)   Resp 20   Ht 5' 3.78" (1.62 m)   Wt 206 lb (93.4 kg)   SpO2 98%   BMI 35.60 kg/m  No intake/output data recorded. Total I/O In: -  Out: 1225 [Urine:1225]  FHT:  FHR: 140 bpm, variability: moderate,  accelerations:  Present,  decelerations:  Present Prolonged decleration x 7 minutes @ ~10:30 am with FHR down to 80s, returned to baseline with moderate variability after intrauterine resuscitation including position change, IV fluid bolus UC:   regular, every 3 minutes SVE:   Dilation: 6 Effacement (%): 60 Station: -3 Exam by:: L. Leftwich-Kirby, CNM  Labs: Lab Results  Component Value Date   WBC 9.9 08/22/2017   HGB 11.1 (L) 08/22/2017   HCT 33.8 (L) 08/22/2017   MCV 82.2 08/22/2017   PLT 176 08/22/2017    Assessment / Plan: Spontaneous labor, progressing normally  Labor: Called to bedside to evaluate prolonged deceleration. After exam, vertex position but high station, cervix noted over to maternal right side so likely malposition.  Encourage maternal positions to rotate/improve fetal position.  Continue expectant management. Preeclampsia:  n/a Fetal Wellbeing:  Category II Pain Control:  Epidural I/D:  GBS Pos on PCN Anticipated MOD:  NSVD  Sharen Counter 08/22/2017, 11:05 AM

## 2017-08-22 NOTE — Anesthesia Preprocedure Evaluation (Signed)
Anesthesia Evaluation  Patient identified by MRN, date of birth, ID band Patient awake    Reviewed: Allergy & Precautions, NPO status , Patient's Chart, lab work & pertinent test results  Airway Mallampati: III  TM Distance: >3 FB Neck ROM: Full    Dental  (+) Teeth Intact, Dental Advisory Given   Pulmonary neg pulmonary ROS,    Pulmonary exam normal breath sounds clear to auscultation       Cardiovascular negative cardio ROS Normal cardiovascular exam Rhythm:Regular Rate:Normal     Neuro/Psych negative neurological ROS     GI/Hepatic negative GI ROS, Neg liver ROS,   Endo/Other  Obesity   Renal/GU negative Renal ROS     Musculoskeletal negative musculoskeletal ROS (+)   Abdominal   Peds  Hematology  (+) Blood dyscrasia, anemia , Plt 176k   Anesthesia Other Findings Day of surgery medications reviewed with the patient.  Reproductive/Obstetrics (+) Pregnancy                             Anesthesia Physical Anesthesia Plan  ASA: II  Anesthesia Plan: Epidural   Post-op Pain Management:    Induction:   PONV Risk Score and Plan: Treatment may vary due to age or medical condition  Airway Management Planned:   Additional Equipment:   Intra-op Plan:   Post-operative Plan:   Informed Consent: I have reviewed the patients History and Physical, chart, labs and discussed the procedure including the risks, benefits and alternatives for the proposed anesthesia with the patient or authorized representative who has indicated his/her understanding and acceptance.   Dental advisory given  Plan Discussed with:   Anesthesia Plan Comments: (Patient identified. Risks/Benefits/Options discussed with patient including but not limited to bleeding, infection, nerve damage, paralysis, failed block, incomplete pain control, headache, blood pressure changes, nausea, vomiting, reactions to medication  both or allergic, itching and postpartum back pain. Confirmed with bedside nurse the patient's most recent platelet count. Confirmed with patient that they are not currently taking any anticoagulation, have any bleeding history or any family history of bleeding disorders. Patient expressed understanding and wished to proceed. All questions were answered. )        Anesthesia Quick Evaluation

## 2017-08-22 NOTE — Anesthesia Procedure Notes (Signed)
Epidural Patient location during procedure: OB Start time: 08/22/2017 2:24 AM End time: 08/22/2017 2:29 AM  Staffing Anesthesiologist: Cecile Hearing Performed: anesthesiologist   Preanesthetic Checklist Completed: patient identified, pre-op evaluation, timeout performed, IV checked, risks and benefits discussed and monitors and equipment checked  Epidural Patient position: sitting Prep: DuraPrep Patient monitoring: blood pressure and continuous pulse ox Approach: midline Location: L3-L4 Injection technique: LOR air  Needle:  Needle type: Tuohy  Needle gauge: 17 G Needle length: 9 cm Needle insertion depth: 5 cm Catheter size: 19 Gauge Catheter at skin depth: 10 cm Test dose: negative and Other (1% Lidocaine)  Additional Notes Patient identified.  Risk benefits discussed including failed block, incomplete pain control, headache, nerve damage, paralysis, blood pressure changes, nausea, vomiting, reactions to medication both toxic or allergic, and postpartum back pain.  Patient expressed understanding and wished to proceed.  All questions were answered.  Sterile technique used throughout procedure and epidural site dressed with sterile barrier dressing. No paresthesia or other complications noted. The patient did not experience any signs of intravascular injection such as tinnitus or metallic taste in mouth nor signs of intrathecal spread such as rapid motor block. Please see nursing notes for vital signs. Reason for block:procedure for pain

## 2017-08-22 NOTE — Anesthesia Pain Management Evaluation Note (Signed)
  CRNA Pain Management Visit Note  Patient: Brenda Frazier, 36 y.o., female  "Hello I am a member of the anesthesia team at [facility]. We have an anesthesia team available at all times to provide care throughout the hospital, including epidural management and anesthesia for C-section. I don't know your plan for the delivery whether it a natural birth, water birth, IV sedation, nitrous supplementation, doula or epidural, but we want to meet your pain goals."   1.Was your pain managed to your expectations on prior hospitalizations?   Yes   2.What is your expectation for pain management during this hospitalization?     Epidural  3.How can we help you reach that goal? unsure  Record the patient's initial score and the patient's pain goal.   Pain: 0  Pain Goal: 9 [facility] wants you to be able to say your pain was always managed very well.  Cephus Shelling 08/22/2017

## 2017-08-22 NOTE — H&P (Signed)
LABOR AND DELIVERY ADMISSION HISTORY AND PHYSICAL NOTE  Brenda Frazier is a 36 y.o. female G3P1011 with IUP at [redacted]w[redacted]d by patient report- prenatal care in home country([place])- presenting for SOL.   She came to MAU for frequent strong contractions. When standing up she had a large amount of vaginal bleeding. Upon recheck her cervix had dilated to 3-4 cm.  She reports positive fetal movement. She denies leakage of fluid.  Limited OB US showed cephalic position with fundal placenta  Prenatal History/Complications: Prenatal care in [place] Limited OB records from [place] in scanned chart - mostly results  Past Medical History: Past Medical History:  Diagnosis Date  . Medical history non-contributory     Past Surgical History: Past Surgical History:  Procedure Laterality Date  . NO PAST SURGERIES      Obstetrical History: OB History    Gravida Para Term Preterm AB Living   3 1 1  0 1 1   SAB TAB Ectopic Multiple Live Births   1       1      Social History: Social History   Social History  . Marital status: Married    Spouse name: N/A  . Number of children: N/A  . Years of education: N/A   Occupational History  . Pediatric oncologist    Social History Main Topics  . Smoking status: Never Smoker  . Smokeless tobacco: Never Used  . Alcohol use No  . Drug use: No  . Sexual activity: Yes   Other Topics Concern  . None   Social History Narrative  . None    Family History: Family History  Problem Relation Age of Onset  . Diabetes Mother   . Hypertension Mother   . Hypertension Father   . Lymphoma Sister     Allergies: No Known Allergies  Prescriptions Prior to Admission  Medication Sig Dispense Refill Last Dose  . Prenatal Multivit-Min-Fe-FA (PRENATAL VITAMINS PO) Take by mouth.        Review of Systems   All systems reviewed and negative except as stated in HPI  Blood pressure 114/68, pulse 91, temperature 97.8 F (36.6 C), temperature source Oral, resp.  rate 19, SpO2 99 %. General appearance: alert, cooperative and no distress Lungs: no respiratory distress Heart: regular rate Abdomen: soft, non-tender Extremities: No calf swelling or tenderness Presentation: cephalic by nurse exam3 Fetal monitoring: baseline 145, moderate variability. Accels present no decels Uterine activity: q3-4 minutes Dilation: 3.5 Effacement (%): 60 Station: Ballotable Exam by:: Camelia Eng RN  Prenatal labs: ABO, Rh: --/--/A POS, A POS (08/08 1910) Antibody: NEG (08/08 1910) Rubella: non-immune RPR: Non Reactive (08/08 1910)  HBsAg: Negative (08/08 1910)  HIV:   NR GBS:   positive Genetic screening:  Normal per patient Anatomy US: normal per patient  Prenatal Transfer Tool  Maternal Diabetes: No Genetic Screening: Normal Maternal Ultrasounds/Referrals: Normal Fetal Ultrasounds or other Referrals:  None Maternal Substance Abuse:  No Significant Maternal Medications:  None Significant Maternal Lab Results: None  Results for orders placed or performed during the hospital encounter of 08/21/17 (from the past 24 hour(s))  CBC   Collection Time: 08/22/17 12:45 AM  Result Value Ref Range   WBC 9.9 4.0 - 10.5 K/uL   RBC 4.11 3.87 - 5.11 MIL/uL   Hemoglobin 11.1 (L) 12.0 - 15.0 g/dL   HCT 41.3 (L) 24.4 - 01.0 %   MCV 82.2 78.0 - 100.0 fL   MCH 27.0 26.0 - 34.0 pg   MCHC  32.8 30.0 - 36.0 g/dL   RDW 16.1 09.6 - 04.5 %   Platelets 176 150 - 400 K/uL    Patient Active Problem List   Diagnosis Date Noted  . Normal labor 08/22/2017  . Supervision of other normal pregnancy, antepartum 08/18/2017    Assessment: Brenda Frazier is a 36 y.o. G3P1011 at [redacted]w[redacted]d here for SOL. Prenatal history unknown.   #Labor: progressing naturally. Expectant management #Pain: Desires epidural #FWB: Cat 1 tracing #ID: GBS + start PCN #MOF: bottle #MOC: unsure #Circ:  outpatient  Tillman Sers, DO PGY-2 8/27/20181:31 AM  OB FELLOW HISTORY AND PHYSICAL  ATTESTATION  I confirm that I have verified the information documented in the resident's note and that I have also personally reperformed the physical exam and all medical decision making activities. I agree with above documentation and have made edits as needed.   Caryl Ada OB Fellow 08/22/2017, 2:34 AM

## 2017-08-22 NOTE — MAU Note (Signed)
Patient was in stable condition and discussed discharge instructions with nurse and sister.  Upon leaving, patient was walking off the unit and felt something running down her leg.  Patient's sister called nurse's attention to patient who had started dripping blood when walking off the unit.  Upon return to the room, patient gushed a large amount of blood on the floor.  Patient was then laid back and her cervix was checked by Camelia Eng, RN and found to be 3-4 cm dilated.  Dr. Nira Retort directed to bedside to assess patient.

## 2017-08-22 NOTE — Progress Notes (Signed)
Brenda Frazier is a 36 y.o. G3P1011 at [redacted]w[redacted]d admitted for active labor, vaginal bleeding  Subjective: Pt comfortable with epidural, family member in room for support  Objective: BP 105/73   Pulse 96   Temp 99.1 F (37.3 C) (Oral)   Resp 18   Ht 5' 3.78" (1.62 m)   Wt 206 lb (93.4 kg)   SpO2 98%   BMI 35.60 kg/m  No intake/output data recorded. Total I/O In: -  Out: 2225 [Urine:2225]  FHT:  FHR: 145 bpm, variability: moderate,  accelerations:  Abscent,  decelerations:  Present variables UC:   regular, every 2-3 minutes SVE:   Dilation: 10 Effacement (%): 100 Station: -2 Exam by:: Sharen Counter, CNM  Labs: Lab Results  Component Value Date   WBC 9.9 08/22/2017   HGB 11.1 (L) 08/22/2017   HCT 33.8 (L) 08/22/2017   MCV 82.2 08/22/2017   PLT 176 08/22/2017    Assessment / Plan: Spontaneous labor, progressing normally  Labor: Pt labored down x 2+ hours with minimal descent of fetal head. Will try pushing to see if improves descent. Reevaluate in 1 hour. Preeclampsia:  n/a Fetal Wellbeing:  Category II Pain Control:  Epidural I/D:  GBS pos on PCN Anticipated MOD:  NSVD  Sharen Counter 08/22/2017, 6:18 PM

## 2017-08-22 NOTE — MAU Provider Note (Signed)
Patient seen for labor evaluation, and after 2 hours of obs and no cervical change on SVE, she was discharged. While patient ambulating to go home, had significant amount of bleeding, with puddle of blood noted on the floor. She was brought to the room and on SVE, noted to be 3-4cm from closed.   Patient had PNC in [place]. She reports normal pregnancy and normal ultrasound this pregnancy, but reports that placenta was low-lying initially.   U/S obtained here in MAU to r/o placenta previa, and showed fundal placenta.  Patient being admitted. See H&P.   Brenda Fanning P. Nahuel Wilbert, MD OB Fellow

## 2017-08-22 NOTE — Progress Notes (Signed)
Phelps, DO, notified of FHR baseline change from 145 to 160, UC pattern, and maternal pulse ranging from 70 to 130 with patient reporting that she feels her heart racing followed by shortness of breath. Pulse ox applied and verified by palpation. Scant amount of bleeding noted. RN to notify Wonda Olds, MD, and have her come assess.

## 2017-08-22 NOTE — Progress Notes (Signed)
Dondrea Caviness is a 36 y.o. G3P1011 at [redacted]w[redacted]d admitted for active labor, vaginal bleeding post term.  Subjective: Pt comfortable with epidural, family member in room for support.  Objective: BP (!) 77/45   Pulse 96   Temp 99 F (37.2 C) (Oral)   Resp 20   Ht 5' 3.78" (1.62 m)   Wt 206 lb (93.4 kg)   SpO2 98%   BMI 35.60 kg/m  No intake/output data recorded. Total I/O In: -  Out: 1225 [Urine:1225]  FHT:  FHR: 140 bpm, variability: moderate,  accelerations:  Abscent,  decelerations:  Present variables and prolonged lasting 2-3 minutes x 2, terbutaline 0.25 given x 1 dose after first prolonged, cervix was 9 cm. Pt repositioned and FHR tracing improved without contractions but variables returned as labor retuned and cervix reexamined after another prolonged decel and pt found to be 10/100/-2.  Will labor down as long as FHR tolerates until lower station. UC:   regular, every 3 minutes SVE:   Dilation: 10 Effacement (%): 100 Station: -2 Exam by:: Sharen Counter, CNM  Labs: Lab Results  Component Value Date   WBC 9.9 08/22/2017   HGB 11.1 (L) 08/22/2017   HCT 33.8 (L) 08/22/2017   MCV 82.2 08/22/2017   PLT 176 08/22/2017    Assessment / Plan: Spontaneous labor, progressing normally  Labor: Progressing normally Preeclampsia:  n/a Fetal Wellbeing:  Category II Pain Control:  Epidural I/D:  GBS pos on PCN Anticipated MOD:  NSVD  Sharen Counter 08/22/2017, 4:03 PM

## 2017-08-22 NOTE — MAU Note (Signed)
I have communicated with Dr. Wonda Olds and reviewed vital signs:  Vitals:   08/21/17 2151  BP: 124/72  Pulse: 96  Resp: (!) 21  Temp: 97.8 F (36.6 C)  SpO2: 99%    Vaginal exam:  Dilation: Closed Effacement (%): 60 Cervical Position: Posterior Exam by:: Robbie Lis, RN,   Also reviewed contraction pattern and that non-stress test is reactive.  It has been documented that patient is contracting every 2-5 minutes with no cervical change over 2 hours not indicating active labor.  Patient denies any other complaints.  Based on this report provider has given order for discharge.  A discharge order and diagnosis entered by a provider.   Labor discharge instructions reviewed with patient.

## 2017-08-22 NOTE — Progress Notes (Signed)
Brenda Frazier is a 36 y.o. G3P1011 at [redacted]w[redacted]d admitted for active labor, vaginal bleeding  Subjective: Pt feeling rectal pressure with contractions  Objective: BP 99/61   Pulse 83   Temp 99.1 F (37.3 C) (Oral)   Resp 20   Ht 5' 3.78" (1.62 m)   Wt 206 lb (93.4 kg)   SpO2 98%   BMI 35.60 kg/m  I/O last 3 completed shifts: In: -  Out: 2225 [Urine:2225] No intake/output data recorded.  FHT:  FHR: 140 bpm, variability: moderate,  accelerations:  Abscent,  decelerations:  Present variables UC:   regular, every 3 minutes SVE:   Dilation: 10 Effacement (%): 100 Station: -2, -1 (during pushing) Exam by:: Enis Slipper, RN  Labs: Lab Results  Component Value Date   WBC 9.9 08/22/2017   HGB 11.1 (L) 08/22/2017   HCT 33.8 (L) 08/22/2017   MCV 82.2 08/22/2017   PLT 176 08/22/2017    Assessment / Plan: Arrest of decent  Labor: Pt becoming tired with pushing so will rest 30 minutes, labor down with good maternal position to promote descent and resume pushing.  Preeclampsia:  n/a Fetal Wellbeing:  Category II Pain Control:  Epidural I/D:  GBS pos Anticipated MOD:  NSVD  Sharen Counter 08/22/2017, 8:31 PM

## 2017-08-23 LAB — CBC
HCT: 24.8 % — ABNORMAL LOW (ref 36.0–46.0)
Hemoglobin: 8.5 g/dL — ABNORMAL LOW (ref 12.0–15.0)
MCH: 27.7 pg (ref 26.0–34.0)
MCHC: 34.3 g/dL (ref 30.0–36.0)
MCV: 80.8 fL (ref 78.0–100.0)
PLATELETS: 141 10*3/uL — AB (ref 150–400)
RBC: 3.07 MIL/uL — AB (ref 3.87–5.11)
RDW: 14.3 % (ref 11.5–15.5)
WBC: 10.2 10*3/uL (ref 4.0–10.5)

## 2017-08-23 MED ORDER — MISOPROSTOL 200 MCG PO TABS
800.0000 ug | ORAL_TABLET | Freq: Once | ORAL | Status: AC
  Administered 2017-08-23: 800 ug via RECTAL

## 2017-08-23 MED ORDER — DIPHENHYDRAMINE HCL 25 MG PO CAPS: 25.0000 mg | ORAL_CAPSULE | Freq: Four times a day (QID) | ORAL | Status: DC | PRN

## 2017-08-23 MED ORDER — BENZOCAINE-MENTHOL 20-0.5 % EX AERO
1.0000 "application " | INHALATION_SPRAY | CUTANEOUS | Status: DC | PRN
  Filled 2017-08-23: qty 56

## 2017-08-23 MED ORDER — SIMETHICONE 80 MG PO CHEW
80.0000 mg | CHEWABLE_TABLET | ORAL | Status: DC | PRN
  Administered 2017-08-23: 80 mg via ORAL
  Filled 2017-08-23: qty 1

## 2017-08-23 MED ORDER — PRENATAL MULTIVITAMIN CH
1.0000 | ORAL_TABLET | Freq: Every day | ORAL | Status: DC
  Administered 2017-08-23 – 2017-08-24 (×2): 1 via ORAL
  Filled 2017-08-23 (×2): qty 1

## 2017-08-23 MED ORDER — SENNOSIDES-DOCUSATE SODIUM 8.6-50 MG PO TABS
2.0000 | ORAL_TABLET | ORAL | Status: DC
  Administered 2017-08-23 – 2017-08-24 (×2): 2 via ORAL
  Filled 2017-08-23 (×2): qty 2

## 2017-08-23 MED ORDER — WITCH HAZEL-GLYCERIN EX PADS: 1.0000 "application " | MEDICATED_PAD | CUTANEOUS | Status: DC | PRN

## 2017-08-23 MED ORDER — ONDANSETRON HCL 4 MG/2ML IJ SOLN: 4.0000 mg | INTRAMUSCULAR | Status: DC | PRN

## 2017-08-23 MED ORDER — DIBUCAINE 1 % RE OINT: 1.0000 "application " | TOPICAL_OINTMENT | RECTAL | Status: DC | PRN

## 2017-08-23 MED ORDER — MISOPROSTOL 200 MCG PO TABS
ORAL_TABLET | ORAL | Status: AC
  Administered 2017-08-23: 800 ug via RECTAL
  Filled 2017-08-23: qty 4

## 2017-08-23 MED ORDER — ACETAMINOPHEN 325 MG PO TABS
650.0000 mg | ORAL_TABLET | ORAL | Status: DC | PRN
  Administered 2017-08-23 – 2017-08-24 (×3): 650 mg via ORAL
  Filled 2017-08-23 (×3): qty 2

## 2017-08-23 MED ORDER — TETANUS-DIPHTH-ACELL PERTUSSIS 5-2.5-18.5 LF-MCG/0.5 IM SUSP: 0.5000 mL | Freq: Once | INTRAMUSCULAR | Status: DC

## 2017-08-23 MED ORDER — IBUPROFEN 600 MG PO TABS
600.0000 mg | ORAL_TABLET | Freq: Four times a day (QID) | ORAL | Status: DC
  Administered 2017-08-23 – 2017-08-24 (×7): 600 mg via ORAL
  Filled 2017-08-23 (×6): qty 1

## 2017-08-23 MED ORDER — ONDANSETRON HCL 4 MG PO TABS: 4.0000 mg | ORAL_TABLET | ORAL | Status: DC | PRN

## 2017-08-23 MED ORDER — COCONUT OIL OIL: 1.0000 "application " | TOPICAL_OIL | Status: DC | PRN

## 2017-08-23 MED ORDER — ZOLPIDEM TARTRATE 5 MG PO TABS: 5.0000 mg | ORAL_TABLET | Freq: Every evening | ORAL | Status: DC | PRN

## 2017-08-23 NOTE — Anesthesia Postprocedure Evaluation (Signed)
Anesthesia Post Note  Patient: Brenda Frazier  Procedure(s) Performed: * No procedures listed *     Patient location during evaluation: Mother Baby Anesthesia Type: Epidural Level of consciousness: awake and alert and oriented Pain management: pain level controlled Vital Signs Assessment: post-procedure vital signs reviewed and stable Respiratory status: spontaneous breathing and nonlabored ventilation Cardiovascular status: stable Postop Assessment: no headache, patient able to bend at knees, no backache, no signs of nausea or vomiting, epidural receding and adequate PO intake Anesthetic complications: no    Last Vitals:  Vitals:   08/23/17 0225 08/23/17 0625  BP: (!) 96/59 97/68  Pulse: 88 82  Resp: 18 18  Temp: 37.1 C 36.5 C  SpO2: 97% 98%    Last Pain:  Vitals:   08/23/17 0735  TempSrc:   PainSc: 0-No pain   Pain Goal: Patients Stated Pain Goal: 4 (08/22/17 0722)               Laban Emperor

## 2017-08-23 NOTE — Progress Notes (Signed)
Post Partum Day #1 Subjective: no complaints, up ad lib, voiding, tolerating PO and reports normal lochia  Objective: Blood pressure (!) 96/59, pulse 88, temperature 98.7 F (37.1 C), temperature source Oral, resp. rate 18, height 5' 3.78" (1.62 m), weight 93.4 kg (206 lb), SpO2 97 %, unknown if currently breastfeeding.  Physical Exam:  General: alert Lochia: appropriate Uterine Fundus: firm and appropriately tender, +4 on the right (?fibroid). Lochia noted on pad to be normal DVT Evaluation: No evidence of DVT seen on physical exam.   Recent Labs  08/22/17 0045 08/23/17 0508  HGB 11.1* 8.5*  HCT 33.8* 24.8*    Assessment/Plan: Plan for discharge tomorrow  Encouraged uterine massage CBC tomorrow   LOS: 1 day   Brenda Frazier C Duaine Radin 08/23/2017, 6:29 AM

## 2017-08-23 NOTE — Anesthesia Postprocedure Evaluation (Signed)
Anesthesia Post Note  Patient: Brenda Frazier  Procedure(s) Performed: * No procedures listed *     Patient location during evaluation: Mother Baby Anesthesia Type: Epidural Level of consciousness: awake and alert Pain management: pain level controlled Vital Signs Assessment: post-procedure vital signs reviewed and stable Respiratory status: spontaneous breathing, nonlabored ventilation and respiratory function stable Cardiovascular status: stable Postop Assessment: no headache, no backache and epidural receding Anesthetic complications: no    Last Vitals:  Vitals:   08/23/17 0225 08/23/17 0625  BP: (!) 96/59 97/68  Pulse: 88 82  Resp: 18 18  Temp: 37.1 C 36.5 C  SpO2: 97% 98%    Last Pain:  Vitals:   08/23/17 0625  TempSrc: Oral  PainSc: 7                  Oswell Say

## 2017-08-23 NOTE — Plan of Care (Signed)
Problem: Pain Managment: Goal: General experience of comfort will improve Outcome: Progressing Pt maintaining adequate pain control with scheduled meds.  Use faces for pain scale.  Problem: Life Cycle: Goal: Risk for postpartum hemorrhage will decrease Outcome: Progressing Fundal height 3 above umbilicus, however, bleeding has been normal without clots; pt has not been symptomatic.

## 2017-08-23 NOTE — Addendum Note (Signed)
Addendum  created 08/23/17 0739 by Elgie Congo, CRNA   Charge Capture section accepted, Sign clinical note

## 2017-08-24 LAB — CBC
HCT: 23.5 % — ABNORMAL LOW (ref 36.0–46.0)
Hemoglobin: 7.7 g/dL — ABNORMAL LOW (ref 12.0–15.0)
MCH: 27.5 pg (ref 26.0–34.0)
MCHC: 32.8 g/dL (ref 30.0–36.0)
MCV: 83.9 fL (ref 78.0–100.0)
PLATELETS: 147 10*3/uL — AB (ref 150–400)
RBC: 2.8 MIL/uL — AB (ref 3.87–5.11)
RDW: 14.4 % (ref 11.5–15.5)
WBC: 7.9 10*3/uL (ref 4.0–10.5)

## 2017-08-24 MED ORDER — FERROUS SULFATE 325 (65 FE) MG PO TABS
325.0000 mg | ORAL_TABLET | Freq: Two times a day (BID) | ORAL | Status: DC
  Administered 2017-08-24: 325 mg via ORAL
  Filled 2017-08-24: qty 1

## 2017-08-24 MED ORDER — IBUPROFEN 600 MG PO TABS: 600.0000 mg | ORAL_TABLET | Freq: Four times a day (QID) | ORAL | 0 refills | Status: DC

## 2017-08-24 MED ORDER — DOCUSATE SODIUM 100 MG PO CAPS: 100.0000 mg | ORAL_CAPSULE | Freq: Two times a day (BID) | ORAL | 2 refills | Status: AC | PRN

## 2017-08-24 NOTE — Lactation Note (Signed)
This note was copied from a baby's chart. Lactation Consultation Note  Patient Name: Brenda Frazier [id number]'s Date: 08/24/2017 Reason for consult: Follow-up assessment   With this mom and term baby. Mom is breast feeding on her right breast, with an everted nipple. I is too uncomfortable for her to use her left breast, with the inverted nipple.  I gave mom a manual hand pump, and with gently pumping her inverted nipple, it easily everted. I advised mom to use this prior to latching on this side, which can make it easier for the baby to latch. It also is a way to pump this breast, and supplement BF with EBM before formula. Mom has not problem latching on her right breast.  I also told mom she could exclusively breast feed on one side, and her breast would supply enough for the baby.  Mom knows to call for questions/conerns.    Maternal Data Has patient been taught Hand Expression?: Yes Does the patient have breastfeeding experience prior to this delivery?: Yes  Feeding Feeding Type: Breast Fed Length of feed: 30 min  LATCH Score                   Interventions    Lactation Tools Discussed/Used Pump Review: Setup, frequency, and cleaning;Milk Storage   Consult Status Consult Status: Complete Follow-up type: Call as needed    Alfred LevinsLee, Ady Heimann Anne 08/24/2017, 10:46 AM

## 2017-08-24 NOTE — Progress Notes (Signed)
CSW received consult due to score greater than 9, or positive for SI on Edinburgh Depression Screen.    When CSW arrived, MOB was attaching and bonding with infant as evident by engaging in skin to skin.  MOB's mother and 35 year old daughter was also present when CSW arrived,  MOB gave CSW permission to meet with MOB while MOB's guest were present.  CSW explained CSW's role and encouraged MOB to ask questions.  MOB acknowledged PPD signs and symptoms after MOB's first child 7 years ago.  MOB described daily crying, feelings of being alone, and sleepless nights.  MOB stated that symptoms last for about 1 month and subsided without interventions.   CSW provided education regarding Baby Blues vs PMADs and provided MOB with information [facility].  CSW encouraged MOB to evaluate her mental health throughout the postpartum period with the use of the New Mom Checklist developed by Postpartum Progress and notify a medical professional if symptoms arise.    There are no barriers to d/c.   Blaine HamperAngel Boyd-Gilyard, MSW, LCSW Clinical Social Work [phone]

## 2017-08-24 NOTE — Discharge Instructions (Signed)
Breast Pumping Tips °If you are breastfeeding, there may be times when you cannot feed your baby directly. Returning to work or going on a trip are common examples. Pumping allows you to store breast milk and feed it to your baby later. °You may not get much milk when you first start to pump. Your breasts should start to make more after a few days. If you pump at the times you usually feed your baby, you may be able to keep making enough milk to feed your baby without also using formula. The more often you pump, the more milk you will produce. °When should I pump? °· You can begin to pump soon after delivery. However, some experts recommend waiting about 4 weeks before giving your infant a bottle to make sure breastfeeding is going well. °· If you plan to return to work, begin pumping a few weeks before. This will help you develop techniques that work best for you. It also lets you build up a supply of breast milk. °· When you are with your infant, feed on demand and pump after each feeding. °· When you are away from your infant for several hours, pump for about 15 minutes every 2-3 hours. Pump both breasts at the same time if you can. °· If your infant has a formula feeding, make sure to pump around the same time. °· If you drink any alcohol, wait 2 hours before pumping. °How do I prepare to pump? °Your let-down reflex is the natural reaction to stimulation that makes your breast milk flow. It is easier to stimulate this reflex when you are relaxed. Find relaxation techniques that work for you. If you have difficulty with your let-down reflex, try these methods: °· Smell one of your infant's blankets or an item of clothing. °· Look at a picture or video of your infant. °· Sit in a quiet, private space. °· Massage the breast you plan to pump. °· Place soothing warmth on the breast. °· Play relaxing music. ° °What are some general breast pumping tips? °· Wash your hands before you pump. You do not need to wash your  nipples or breasts. °· There are three ways to pump. °? You can use your hand to massage and compress your breast. °? You can use a handheld manual pump. °? You can use an electric pump. °· Make sure the suction cup (flange) on the breast pump is the right size. Place the flange directly over the nipple. If it is the wrong size or placed the wrong way, it may be painful and cause nipple damage. °· If pumping is uncomfortable, apply a small amount of purified or modified lanolin to your nipple and areola. °· If you are using an electric pump, adjust the speed and suction power to be more comfortable. °· If pumping is painful or if you are not getting very much milk, you may need a different type of pump. A lactation consultant can help you determine what type of pump to use. °· Keep a full water bottle near you at all times. Drinking lots of fluid helps you make more milk. °· You can store your milk to use later. Pumped breast milk can be stored in a sealable, sterile container or plastic bag. Label all stored breast milk with the date you pumped it. °? Milk can stay out at room temperature for up to 8 hours. °? You can store your milk in the refrigerator for up to 8 days. °? You can   store your milk in the freezer for 3 months. Thaw frozen milk using warm water. Do not put it in the microwave.  Do not smoke. Smoking can lower your milk supply and harm your infant. If you need help quitting, ask your health care provider to recommend a program. When should I call my health care provider or a lactation consultant?  You are having trouble pumping.  You are concerned that you are not making enough milk.  You have nipple pain, soreness, or redness.  You want to use birth control. Birth control pills may lower your milk supply. Talk to your health care provider about your options. This information is not intended to replace advice given to you by your health care provider. Make sure you discuss any questions  you have with your health care provider. Document Released: 06/02/2010 Document Revised: 05/26/2016 Document Reviewed: 10/05/2013 Elsevier Interactive Patient Education  2017 [employer] Home Care Instructions for Mom ACTIVITY  Gradually return to your regular activities.  Let yourself rest. Nap while your baby sleeps.  Avoid lifting anything that is heavier than 10 lb (4.5 kg) until your health care provider says it is okay.  Avoid activities that take a lot of effort and energy (are strenuous) until approved by your health care provider. Walking at a slow-to-moderate pace is usually safe.  If you had a cesarean delivery: ? Do not vacuum, climb stairs, or drive a car for 4-6 weeks. ? Have someone help you at home until you feel like you can do your usual activities yourself. ? Do exercises as told by your health care provider, if this applies.  VAGINAL BLEEDING You may continue to bleed for 4-6 weeks after delivery. Over time, the amount of blood usually decreases and the color of the blood usually gets lighter. However, the flow of bright red blood may increase if you have been too active. If you need to use more than one pad in an hour because your pad gets soaked, or if you pass a large clot:  Lie down.  Raise your feet.  Place a cold compress on your lower abdomen.  Rest.  Call your health care provider.  If you are breastfeeding, your period should return anytime between 8 weeks after delivery and the time that you stop breastfeeding. If you are not breastfeeding, your period should return 6-8 weeks after delivery. PERINEAL CARE The perineal area, or perineum, is the part of your body between your thighs. After delivery, this area needs special care. Follow these instructions as told by your health care provider.  Take warm tub baths for 15-20 minutes.  Use medicated pads and pain-relieving sprays and creams as told.  Do not use tampons or douches until vaginal  bleeding has stopped.  Each time you go to the bathroom: ? Use a peri bottle. ? Change your pad. ? Use towelettes in place of toilet paper until your stitches have healed.  Do Kegel exercises every day. Kegel exercises help to maintain the muscles that support the vagina, bladder, and bowels. You can do these exercises while you are standing, sitting, or lying down. To do Kegel exercises: ? Tighten the muscles of your abdomen and the muscles that surround your birth canal. ? Hold for a few seconds. ? Relax. ? Repeat until you have done this 5 times in a row.  To prevent hemorrhoids from developing or getting worse: ? Drink enough fluid to keep your urine clear or pale yellow. ? Avoid straining when  having a bowel movement. ? Take over-the-counter medicines and stool softeners as told by your health care provider.  BREAST CARE  Wear a tight-fitting bra.  Avoid taking over-the-counter pain medicine for breast discomfort.  Apply ice to the breasts to help with discomfort as needed: ? Put ice in a plastic bag. ? Place a towel between your skin and the bag. ? Leave the ice on for 20 minutes or as told by your health care provider.  NUTRITION  Eat a well-balanced diet.  Do not try to lose weight quickly by cutting back on calories.  Take your prenatal vitamins until your postpartum checkup or until your health care provider tells you to stop.  POSTPARTUM DEPRESSION You may find yourself crying for no apparent reason and unable to cope with all of the changes that come with having a newborn. This mood is called postpartum depression. Postpartum depression happens because your hormone levels change after delivery. If you have postpartum depression, get support from your partner, friends, and family. If the depression does not go away on its own after several weeks, contact your health care provider. BREAST SELF-EXAM Do a breast self-exam each month, at the same time of the month. If  you are breastfeeding, check your breasts just after a feeding, when your breasts are less full. If you are breastfeeding and your period has started, check your breasts on day 5, 6, or 7 of your period. Report any lumps, bumps, or discharge to your health care provider. Know that breasts are normally lumpy if you are breastfeeding. This is temporary, and it is not a health risk. INTIMACY AND SEXUALITY Avoid sexual activity for at least 3-4 weeks after delivery or until the brownish-red vaginal flow is completely gone. If you want to avoid pregnancy, use some form of birth control. You can get pregnant after delivery, even if you have not had your period. SEEK MEDICAL CARE IF:  You feel unable to cope with the changes that a child brings to your life, and these feelings do not go away after several weeks.  You notice a lump, a bump, or discharge on your breast.  SEEK IMMEDIATE MEDICAL CARE IF:  Blood soaks your pad in 1 hour or less.  You have: ? Severe pain or cramping in your lower abdomen. ? A bad-smelling vaginal discharge. ? A fever that is not controlled by medicine. ? A fever, and an area of your breast is red and sore. ? Pain or redness in your calf. ? Sudden, severe chest pain. ? Shortness of breath. ? Painful or bloody urination. ? Problems with your vision.  You vomit for 12 hours or longer.  You develop a severe headache.  You have serious thoughts about hurting yourself, your child, or anyone else.  This information is not intended to replace advice given to you by your health care provider. Make sure you discuss any questions you have with your health care provider. Document Released: 12/10/2000 Document Revised: 05/20/2016 Document Reviewed: 06/16/2015 Elsevier Interactive Patient Education  2017 [employer].

## 2017-08-24 NOTE — Discharge Summary (Signed)
Physician Discharge Summary  Patient ID: Brenda Frazier MRN: [id number] DOB/AGE: April 14, 1981 36 y.o.  Admit date: 08/21/2017  Delivering MD: Dr. Doroteo GlassmanPhelps Discharge date: 08/24/2017  Admitting diagnosis: SOL Intrauterine pregnancy: 3823w2d     Secondary diagnosis:  Active Problems:   vaginal bleeding at term        Additional problems:    GBS +, adequately treated, vacuum assisted vaginal delivery   avulsion of cord w/ manual extraction of placenta                              Discharge diagnosis: Term Delivery                                                                                               Post partum procedures: none  Augmentation: none  Hospital course: 36 y.o. [id number] at 4823w2d was admitted to the hospital 08/22/2017 for SOL Membrane Rupture Time/Date:  08/22/17 1238 Patient delivered a Viable infant VAVD Vaginal, Vacuum (Extractor).  Anesthesia:  Epidural Episiotomy: None Lacerations: 2nd degree deep Suture Repair: 3.0 vicryl Est. Blood Loss (mL): 500  Pateint had an uncomplicated postpartum course.  She is ambulating, tolerating a regular diet, passing flatus, and urinating well. Bleeding is moderate but patient describes it as more than a regular period. Paitent has hb of 7.7 but is asymptomatic and will be receiving iron supplementation. Patient is discharged home in stable condition on  08/24/17  Discharge Exam: Blood pressure 97/64, pulse 74, temperature 97.8 F (36.6 C), temperature source Oral, resp. rate 18, height 5' 3.78" (1.62 m), weight 93.4 kg (206 lb), SpO2 98 %, unknown if currently breastfeeding.  General: alert, cooperative and no distress Lochia:normal flow Heart: RRR no m/r/g Abdomen: soft, nontender, fundus firm at/below umbilicus Uterine Fundus: firm, below unbilicus DVT Evaluation: No evidence of DVT seen on physical exam. Extremities: trace edema  Disposition: 01-Home or Self Care    Diet: routine diet  Activity: Advance as tolerated.  Pelvic rest for 6 weeks.   Outpatient follow up:4-6 weeks Follow up Appt: Future Appointments Date Time Provider Department Center         Follow up Visit:No Follow-up on file.  Postpartum contraception: IUD  Newborn Data: Live born female Birth Weight: 8 lb 13.3 oz (4006 g) APGAR: 8, 9  Baby Feeding: bottle Disposition:home with mother    Signed: Obie Dredgeelly TWeledji 08/24/2017, 6:49 AM   I confirm that I have verified the information documented in the Med student's note and that I have also personally reperformed the physical exam and all medical decision making activities.  Patient was seen and examined by me also Agree with note Vitals stable Labs stable Fundus firm, lochia within normal limits Perineum healing Ext WNL  Lactation nurse and Pediatrician in room  Ready for discharge  Aviva SignsWilliams, Nitish Roes L, CNM

## 2017-08-26 ENCOUNTER — Encounter: Payer: Self-pay | Admitting: *Deleted

## 2017-08-27 ENCOUNTER — Inpatient Hospital Stay (HOSPITAL_COMMUNITY): Admit: 2017-08-27 | Payer: Self-pay

## 2017-10-03 ENCOUNTER — Telehealth: Payer: Self-pay | Admitting: Advanced Practice Midwife

## 2017-10-03 ENCOUNTER — Ambulatory Visit: Payer: Self-pay | Admitting: Advanced Practice Midwife

## 2017-10-03 ENCOUNTER — Other Ambulatory Visit: Payer: Self-pay | Admitting: *Deleted

## 2017-10-03 MED ORDER — IBUPROFEN 600 MG PO TABS: 600.0000 mg | ORAL_TABLET | Freq: Four times a day (QID) | ORAL | 1 refills | Status: DC

## 2017-10-03 MED ORDER — IBUPROFEN 600 MG PO TABS: 600.0000 mg | ORAL_TABLET | Freq: Four times a day (QID) | ORAL | 1 refills | Status: AC

## 2017-10-03 NOTE — Telephone Encounter (Signed)
Patient missed her postpartum visit. Her phone is not receiving voicemail. I miss send her a letter.

## 2017-10-03 NOTE — Progress Notes (Signed)
Fax received for refill request of Ibuprofen which was approved by Dr. Marice Potter. Rx e-prescribed.
